# Patient Record
Sex: Male | Born: 1970 | Hispanic: Yes | Marital: Married | Smoking: Former smoker
Health system: Southern US, Community
[De-identification: ages and names within clinical notes are randomized; demographics above are authoritative.]

---

## 2018-06-09 ENCOUNTER — Emergency Department: Payer: No Typology Code available for payment source

## 2018-06-09 ENCOUNTER — Emergency Department
Admission: EM | Admit: 2018-06-09 | Discharge: 2018-06-10 | Disposition: A | Discharge status: Home / Self Care | Payer: No Typology Code available for payment source | Attending: Emergency Medicine | Admitting: Emergency Medicine

## 2018-06-09 ENCOUNTER — Encounter: Payer: Self-pay | Admitting: Radiology

## 2018-06-09 DIAGNOSIS — Y9389 Activity, other specified: Secondary | ICD-10-CM | POA: Diagnosis not present

## 2018-06-09 DIAGNOSIS — F10920 Alcohol use, unspecified with intoxication, uncomplicated: Secondary | ICD-10-CM | POA: Insufficient documentation

## 2018-06-09 DIAGNOSIS — S20211A Contusion of right front wall of thorax, initial encounter: Secondary | ICD-10-CM | POA: Insufficient documentation

## 2018-06-09 DIAGNOSIS — Y929 Unspecified place or not applicable: Secondary | ICD-10-CM | POA: Diagnosis not present

## 2018-06-09 DIAGNOSIS — R220 Localized swelling, mass and lump, head: Secondary | ICD-10-CM | POA: Diagnosis not present

## 2018-06-09 DIAGNOSIS — Y998 Other external cause status: Secondary | ICD-10-CM | POA: Insufficient documentation

## 2018-06-09 DIAGNOSIS — S29091A Other injury of muscle and tendon of front wall of thorax, initial encounter: Secondary | ICD-10-CM | POA: Diagnosis present

## 2018-06-09 LAB — COMPREHENSIVE METABOLIC PANEL
ALBUMIN: 4.9 g/dL (ref 3.5–5.0)
ALT: 85 U/L — ABNORMAL HIGH (ref 0–44)
AST: 72 U/L — ABNORMAL HIGH (ref 15–41)
Alkaline Phosphatase: 78 U/L (ref 38–126)
Anion gap: 11 (ref 5–15)
BUN: 11 mg/dL (ref 6–20)
CO2: 27 mmol/L (ref 22–32)
Calcium: 8.6 mg/dL — ABNORMAL LOW (ref 8.9–10.3)
Chloride: 107 mmol/L (ref 98–111)
Creatinine, Ser: 0.82 mg/dL (ref 0.61–1.24)
GFR calc Af Amer: >60 mL/min (ref >60)
GFR calc non Af Amer: >60 mL/min (ref >60)
Glucose, Bld: 106 mg/dL — ABNORMAL HIGH (ref 70–99)
Potassium: 3.6 mmol/L (ref 3.5–5.1)
Sodium: 145 mmol/L (ref 135–145)
Total Bilirubin: 0.7 mg/dL (ref 0.3–1.2)
Total Protein: 8.6 g/dL — ABNORMAL HIGH (ref 6.5–8.1)

## 2018-06-09 LAB — CBC WITH DIFFERENTIAL/PLATELET
Abs Immature Granulocytes: 0.04 10*3/uL (ref 0.00–0.07)
Basophils Absolute: 0.1 10*3/uL (ref 0.0–0.1)
Basophils Relative: 1 %
Eosinophils Absolute: 0.2 10*3/uL (ref 0.0–0.5)
Eosinophils Relative: 2 %
HCT: 48.4 % (ref 39.0–52.0)
Hemoglobin: 17.1 g/dL — ABNORMAL HIGH (ref 13.0–17.0)
Immature Granulocytes: 1 %
Lymphocytes Relative: 39 %
Lymphs Abs: 3.3 10*3/uL (ref 0.7–4.0)
MCH: 32.7 pg (ref 26.0–34.0)
MCHC: 35.3 g/dL (ref 30.0–36.0)
MCV: 92.5 fL (ref 80.0–100.0)
Monocytes Absolute: 0.5 10*3/uL (ref 0.1–1.0)
Monocytes Relative: 6 %
NEUTROS PCT: 51 %
NRBC: 0 % (ref 0.0–0.2)
Neutro Abs: 4.2 10*3/uL (ref 1.7–7.7)
Platelets: 249 10*3/uL (ref 150–400)
RBC: 5.23 MIL/uL (ref 4.22–5.81)
RDW: 11.5 % (ref 11.5–15.5)
WBC: 8.3 10*3/uL (ref 4.0–10.5)

## 2018-06-09 LAB — LIPASE, BLOOD: Lipase: 79 U/L — ABNORMAL HIGH (ref 11–51)

## 2018-06-09 LAB — ETHANOL: Alcohol, Ethyl (B): 322 mg/dL (ref <10)

## 2018-06-09 MED ORDER — SODIUM CHLORIDE 0.9 % IV BOLUS
1000.0000 mL | Freq: Once | INTRAVENOUS | Status: AC
  Administered 2018-06-09: 1000 mL via INTRAVENOUS

## 2018-06-09 MED ORDER — IOPAMIDOL (ISOVUE-300) INJECTION 61%
100.0000 mL | Freq: Once | INTRAVENOUS | Status: AC | PRN
  Administered 2018-06-09: 100 mL via INTRAVENOUS
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
## 2018-06-09 NOTE — ED Notes (Signed)
Pt still on toilet, says he needs a little more time; officer remains; Dr Scotty Court going in to follow up with pt
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
## 2018-06-09 NOTE — ED Notes (Signed)
Stretcher moved over by toilet in room for pt to have bowel movement; pt did not want this nurse to stay in room as he is embarrassed; officer remains with pt; able to get himself slowly but unassisted to toilet; will pull red cord when finished;
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
## 2018-06-09 NOTE — ED Notes (Signed)
Patient transported to CT 

## 2018-06-09 NOTE — ED Notes (Signed)
Date and time results received: 06/09/18 9:22 PM    Test: Ethanol Critical Value: 322  Name of Provider Notified: Scotty Court
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
## 2018-06-10 NOTE — ED Notes (Signed)
Pt awake and alert; able to ambulate down hallway of nurse's station without difficulty; pt says he has someone that can come pick him up and he will call them

## 2019-09-28 ENCOUNTER — Other Ambulatory Visit: Payer: Self-pay

## 2019-09-28 ENCOUNTER — Emergency Department (HOSPITAL_COMMUNITY): Payer: Self-pay

## 2019-09-28 ENCOUNTER — Encounter (HOSPITAL_COMMUNITY): Payer: Self-pay | Admitting: Family Medicine

## 2019-09-28 ENCOUNTER — Observation Stay (HOSPITAL_COMMUNITY)
Admission: EM | Admit: 2019-09-28 | Discharge: 2019-09-28 | Disposition: A | Discharge status: Home / Self Care | Payer: Self-pay | Attending: Family Medicine | Admitting: Family Medicine

## 2019-09-28 DIAGNOSIS — Z87891 Personal history of nicotine dependence: Secondary | ICD-10-CM | POA: Insufficient documentation

## 2019-09-28 DIAGNOSIS — Z20822 Contact with and (suspected) exposure to covid-19: Secondary | ICD-10-CM | POA: Insufficient documentation

## 2019-09-28 DIAGNOSIS — L500 Allergic urticaria: Secondary | ICD-10-CM | POA: Insufficient documentation

## 2019-09-28 DIAGNOSIS — R079 Chest pain, unspecified: Secondary | ICD-10-CM | POA: Insufficient documentation

## 2019-09-28 DIAGNOSIS — T782XXA Anaphylactic shock, unspecified, initial encounter: Secondary | ICD-10-CM

## 2019-09-28 DIAGNOSIS — X58XXXA Exposure to other specified factors, initial encounter: Secondary | ICD-10-CM | POA: Insufficient documentation

## 2019-09-28 DIAGNOSIS — R0602 Shortness of breath: Secondary | ICD-10-CM | POA: Insufficient documentation

## 2019-09-28 DIAGNOSIS — R42 Dizziness and giddiness: Secondary | ICD-10-CM | POA: Insufficient documentation

## 2019-09-28 DIAGNOSIS — T63441A Toxic effect of venom of bees, accidental (unintentional), initial encounter: Principal | ICD-10-CM | POA: Insufficient documentation

## 2019-09-28 HISTORY — DX: Anaphylactic shock, unspecified, initial encounter: T78.2XXA

## 2019-09-28 LAB — BASIC METABOLIC PANEL
Anion gap: 16 — ABNORMAL HIGH (ref 5–15)
BUN: 19 mg/dL (ref 6–20)
CO2: 18 mmol/L — ABNORMAL LOW (ref 22–32)
Calcium: 8.9 mg/dL (ref 8.9–10.3)
Chloride: 107 mmol/L (ref 98–111)
Creatinine, Ser: 1.09 mg/dL (ref 0.61–1.24)
GFR calc Af Amer: >60 mL/min (ref >60)
GFR calc non Af Amer: >60 mL/min (ref >60)
Glucose, Bld: 156 mg/dL — ABNORMAL HIGH (ref 70–99)
Potassium: 4 mmol/L (ref 3.5–5.1)
Sodium: 141 mmol/L (ref 135–145)

## 2019-09-28 LAB — CBC WITH DIFFERENTIAL/PLATELET
Abs Immature Granulocytes: 0.02 10*3/uL (ref 0.00–0.07)
Basophils Absolute: 0 10*3/uL (ref 0.0–0.1)
Basophils Relative: 0 %
Eosinophils Absolute: 0.1 10*3/uL (ref 0.0–0.5)
Eosinophils Relative: 2 %
HCT: 53.4 % — ABNORMAL HIGH (ref 39.0–52.0)
Hemoglobin: 18.6 g/dL — ABNORMAL HIGH (ref 13.0–17.0)
Immature Granulocytes: 0 %
Lymphocytes Relative: 69 %
Lymphs Abs: 4.9 10*3/uL — ABNORMAL HIGH (ref 0.7–4.0)
MCH: 32.9 pg (ref 26.0–34.0)
MCHC: 34.8 g/dL (ref 30.0–36.0)
MCV: 94.3 fL (ref 80.0–100.0)
Monocytes Absolute: 0.3 10*3/uL (ref 0.1–1.0)
Monocytes Relative: 4 %
Neutro Abs: 1.8 10*3/uL (ref 1.7–7.7)
Neutrophils Relative %: 25 %
Platelets: 284 10*3/uL (ref 150–400)
RBC: 5.66 MIL/uL (ref 4.22–5.81)
RDW: 11.6 % (ref 11.5–15.5)
WBC: 7.1 10*3/uL (ref 4.0–10.5)
nRBC: 0 % (ref 0.0–0.2)

## 2019-09-28 LAB — SARS CORONAVIRUS 2 BY RT PCR (HOSPITAL ORDER, PERFORMED IN ~~LOC~~ HOSPITAL LAB): SARS Coronavirus 2: NEGATIVE

## 2019-09-28 MED ORDER — ONDANSETRON HCL 4 MG/2ML IJ SOLN: 4.0000 mg | Freq: Four times a day (QID) | INTRAMUSCULAR | Status: DC | PRN

## 2019-09-28 MED ORDER — SODIUM CHLORIDE 0.9 % IV BOLUS
1000.0000 mL | Freq: Once | INTRAVENOUS | Status: AC
  Administered 2019-09-28: 1000 mL via INTRAVENOUS

## 2019-09-28 MED ORDER — METHYLPREDNISOLONE SODIUM SUCC 125 MG IJ SOLR
125.0000 mg | Freq: Once | INTRAMUSCULAR | Status: AC
  Administered 2019-09-28: 125 mg via INTRAVENOUS

## 2019-09-28 MED ORDER — ALBUTEROL SULFATE (2.5 MG/3ML) 0.083% IN NEBU: 2.5000 mg | INHALATION_SOLUTION | RESPIRATORY_TRACT | Status: DC | PRN

## 2019-09-28 MED ORDER — ACETAMINOPHEN 325 MG PO TABS: 650.0000 mg | ORAL_TABLET | Freq: Four times a day (QID) | ORAL | Status: DC | PRN

## 2019-09-28 MED ORDER — EPINEPHRINE 0.3 MG/0.3ML IJ SOAJ: 0.3000 mg | INTRAMUSCULAR | 3 refills | Status: DC | PRN

## 2019-09-28 MED ORDER — EPINEPHRINE 1 MG/10ML IJ SOSY
PREFILLED_SYRINGE | INTRAMUSCULAR | Status: AC
  Filled 2019-09-28: qty 10

## 2019-09-28 MED ORDER — ACETAMINOPHEN 650 MG RE SUPP: 650.0000 mg | Freq: Four times a day (QID) | RECTAL | Status: DC | PRN

## 2019-09-28 MED ORDER — EPINEPHRINE 0.3 MG/0.3ML IJ SOAJ
INTRAMUSCULAR | Status: AC
  Administered 2019-09-28: 0.3 mg
  Filled 2019-09-28: qty 0.3

## 2019-09-28 MED ORDER — DIPHENHYDRAMINE HCL 50 MG/ML IJ SOLN
50.0000 mg | Freq: Once | INTRAMUSCULAR | Status: AC
  Administered 2019-09-28: 50 mg via INTRAVENOUS

## 2019-09-28 MED ORDER — METHYLPREDNISOLONE SODIUM SUCC 125 MG IJ SOLR: 60.0000 mg | Freq: Every day | INTRAMUSCULAR | Status: DC

## 2019-09-28 MED ORDER — EPINEPHRINE HCL 5 MG/250ML IV SOLN IN NS
1.0000 ug/min | INTRAVENOUS | Status: DC
  Filled 2019-09-28: qty 250

## 2019-09-28 MED ORDER — DIPHENHYDRAMINE HCL 25 MG PO CAPS
25.0000 mg | ORAL_CAPSULE | Freq: Four times a day (QID) | ORAL | Status: DC | PRN
  Administered 2019-09-28: 25 mg via ORAL
  Filled 2019-09-28: qty 1

## 2019-09-28 MED ORDER — EPINEPHRINE 0.3 MG/0.3ML IJ SOAJ: 0.3000 mg | INTRAMUSCULAR | 3 refills | Status: AC | PRN

## 2019-09-28 MED ORDER — ONDANSETRON HCL 4 MG PO TABS: 4.0000 mg | ORAL_TABLET | Freq: Four times a day (QID) | ORAL | Status: DC | PRN

## 2019-09-28 NOTE — ED Notes (Signed)
ED TO INPATIENT HANDOFF REPORT  ED Nurse Name and Phone #: [phone]  S Name/Age/Gender Craig Lloyd 49 y.o. male Room/Bed: RESB/RESB  Code Status   Code Status: Full Code  Home/SNF/Other Home Patient oriented to: self, place, time and situation Is this baseline? Yes   Triage Complete: Triage complete  Chief Complaint Anaphylaxis [T78.2XXA]  Triage Note Pt to ed with c/o of allergic rx to bee sting. Pt hypotensive, diaphoretic, and lethargic on arrival.     Allergies Allergies  Allergen Reactions  . Bee Venom Anaphylaxis  . Other Anaphylaxis    Red Ants    Level of Care/Admitting Diagnosis ED Disposition    ED Disposition Condition Comment   Admit  Hospital Area: [facility] [[id number]]  Level of Care: Telemetry [5]  Admit to tele based on following criteria: Eval of Syncope  Covid Evaluation: Asymptomatic Screening Protocol (No Symptoms)  Diagnosis: Anaphylaxis [086578]  Admitting Physician: Alberteen Sam [[id number]]  Attending Physician: Alberteen Sam [[id number]]       B Medical/Surgery History Past Medical History:  Diagnosis Date  . Anaphylaxis 09/28/2019   To bee sting   History reviewed. No pertinent surgical history.   A IV Location/Drains/Wounds Patient Lines/Drains/Airways Status   Active Line/Drains/Airways    Name:   Placement date:   Placement time:   Site:   Days:   Peripheral IV 09/28/19 Left Antecubital   09/28/19    0854    Antecubital   less than 1          Intake/Output Last 24 hours  Intake/Output Summary (Last 24 hours) at 09/28/2019 1508 Last data filed at 09/28/2019 1123 Gross per 24 hour  Intake 1000 ml  Output 400 ml  Net 600 ml    Labs/Imaging Results for orders placed or performed during the hospital encounter of 09/28/19 (from the past 48 hour(s))  Basic metabolic panel     Status: Abnormal   Collection Time: 09/28/19  9:10 AM  Result Value Ref Range   Sodium 141 135 - 145 mmol/L   Potassium 4.0 3.5 - 5.1 mmol/L   Chloride 107 98 - 111 mmol/L   CO2 18 (L) 22 - 32 mmol/L   Glucose, Bld 156 (H) 70 - 99 mg/dL    Comment: Glucose reference range applies only to samples taken after fasting for at least 8 hours.   BUN 19 6 - 20 mg/dL   Creatinine, Ser 4.01 0.61 - 1.24 mg/dL   Calcium 8.9 8.9 - 02.7 mg/dL   GFR calc non Af Amer >60 >60 mL/min   GFR calc Af Amer >60 >60 mL/min   Anion gap 16 (H) 5 - 15    Comment: Performed at [facility], [address] [place], [place] [address]  CBC with Differential     Status: Abnormal   Collection Time: 09/28/19  9:10 AM  Result Value Ref Range   WBC 7.1 4.0 - 10.5 K/uL   RBC 5.66 4.22 - 5.81 MIL/uL   Hemoglobin 18.6 (H) 13.0 - 17.0 g/dL   HCT 44.0 (H) 34.7 - 42.5 %   MCV 94.3 80.0 - 100.0 fL   MCH 32.9 26.0 - 34.0 pg   MCHC 34.8 30.0 - 36.0 g/dL   RDW 95.6 38.7 - 56.4 %   Platelets 284 150 - 400 K/uL   nRBC 0.0 0.0 - 0.2 %   Neutrophils Relative % 25 %   Neutro Abs 1.8 1.7 - 7.7 K/uL  Lymphocytes Relative 69 %   Lymphs Abs 4.9 (H) 0.7 - 4.0 K/uL   Monocytes Relative 4 %   Monocytes Absolute 0.3 0.1 - 1.0 K/uL   Eosinophils Relative 2 %   Eosinophils Absolute 0.1 0.0 - 0.5 K/uL   Basophils Relative 0 %   Basophils Absolute 0.0 0.0 - 0.1 K/uL   Immature Granulocytes 0 %   Abs Immature Granulocytes 0.02 0.00 - 0.07 K/uL    Comment: Performed at [facility], [address], [place], [place] [address]  SARS Coronavirus 2 by RT PCR (hospital order, performed in [facility] hospital lab) Nasopharyngeal Nasopharyngeal Swab     Status: None   Collection Time: 09/28/19  9:10 AM   Specimen: Nasopharyngeal Swab  Result Value Ref Range   SARS Coronavirus 2 NEGATIVE NEGATIVE    Comment: (NOTE) SARS-CoV-2 target nucleic acids are NOT DETECTED. The SARS-CoV-2 RNA is generally detectable in upper and lower respiratory specimens during the acute phase of infection. The lowest concentration  of SARS-CoV-2 viral copies this assay can detect is 250 copies / mL. A negative result does not preclude SARS-CoV-2 infection and should not be used as the sole basis for treatment or other patient management decisions.  A negative result may occur with improper specimen collection / handling, submission of specimen other than nasopharyngeal swab, presence of viral mutation(s) within the areas targeted by this assay, and inadequate number of viral copies (<250 copies / mL). A negative result must be combined with clinical observations, patient history, and epidemiological information. Fact Sheet for Patients:   BoilerBrush.com.cy Fact Sheet for Healthcare Providers: https://pope.com/ This test is not yet approved or cleared  by the [place] FDA and has been authorized for detection and/or diagnosis of SARS-CoV-2 by FDA under an Emergency Use Authorization (EUA).  This EUA will remain in effect (meaning this test can be used) for the duration of the COVID-19 declaration under Section 564(b)(1) of the Act, 21 U.S.C. section 360bbb-3(b)(1), unless the authorization is terminated or revoked sooner. Performed at [facility], [address] [place], [place] [address]    DG Chest Portable 1 View  Result Date: 09/28/2019 CLINICAL DATA:  Dyspnea and chest pain EXAM: PORTABLE CHEST 1 VIEW COMPARISON:  06/09/2018 chest radiograph. FINDINGS: Stable cardiomediastinal silhouette with normal heart size. No pneumothorax. No pleural effusion. Lungs appear clear, with no acute consolidative airspace disease and no pulmonary edema. IMPRESSION: No active disease. Electronically Signed   By: Delbert Phenix M.D.   On: 09/28/2019 10:01    Pending Labs Unresulted Labs (From admission, onward)    Start     Ordered   09/28/19 1435  HIV Antibody (routine testing w rflx)  (HIV Antibody (Routine testing w reflex) panel)  Add-on,   AD      09/28/19 1434          Vitals/Pain Today's Vitals   09/28/19 1330 09/28/19 1400 09/28/19 1430 09/28/19 1500  BP: (!) 130/91 (!) 134/97 (!) 139/104 (!) 152/107  Pulse: 91 78 78 71  Resp: 16 15 15 14   SpO2: 97% 95% 93% 97%  PainSc:        Isolation Precautions No active isolations  Medications Medications  EPINEPHrine (ADRENALIN) 1 MG/10ML injection (has no administration in time range)  acetaminophen (TYLENOL) tablet 650 mg (has no administration in time range)    Or  acetaminophen (TYLENOL) suppository 650 mg (has no administration in time range)  ondansetron (ZOFRAN) tablet 4 mg (has no  administration in time range)    Or  ondansetron (ZOFRAN) injection 4 mg (has no administration in time range)  albuterol (PROVENTIL) (2.5 MG/3ML) 0.083% nebulizer solution 2.5 mg (has no administration in time range)  diphenhydrAMINE (BENADRYL) capsule 25 mg (has no administration in time range)  methylPREDNISolone sodium succinate (SOLU-MEDROL) 125 mg/2 mL injection 60 mg (has no administration in time range)  EPINEPHrine (EPI-PEN) 0.3 mg/0.3 mL injection (0.3 mg  Given 09/28/19 0850)  sodium chloride 0.9 % bolus 1,000 mL (0 mLs Intravenous Stopped 09/28/19 1123)  diphenhydrAMINE (BENADRYL) injection 50 mg (50 mg Intravenous Given 09/28/19 0850)  methylPREDNISolone sodium succinate (SOLU-MEDROL) 125 mg/2 mL injection 125 mg (125 mg Intravenous Given 09/28/19 0851)    Mobility walks High fall risk   Focused Assessments Pulmonary Assessment Handoff:  Lung sounds:   O2 Device: Nasal Cannula O2 Flow Rate (L/min): 10 L/min      R Recommendations: See Admitting Provider Note  Report given to: Amy, RN

## 2019-09-28 NOTE — ED Provider Notes (Signed)
[facility]-EMERGENCY DEPT Provider Note   CSN: [id number] Arrival date & time: 09/28/19  0845     History CC: Allergic reaction  Craig Lloyd is a 49 y.o. 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  Patient was brought in by his brother-in-law today.  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  The patient was stung in one of his arms by a bee.  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  He does not carry an EpiPen.  Soon afterwards patient was complaining of dizziness and difficulty speaking to his brother, prompted brought him to the nearest ER, which [facility].  On arrival the patient is awake but unable to verbalize.  His brother in law says that this is a common reaction for him after a bee sting.  His brother-in-law states that the patient has no other medical issues and does not take any other medications at baseline  HPI     Past Medical History:  Diagnosis Date  . Anaphylaxis 09/28/2019   To bee sting    Patient Active Problem List   Diagnosis Date Noted  . Anaphylaxis 09/28/2019    History reviewed. No pertinent surgical history.     Family History  Problem Relation Age of Onset  . Allergy (severe) Neg Hx     Social History   Tobacco Use  . Smoking status: Former Games developer  . Smokeless tobacco: Never Used  Substance Use Topics  . Alcohol use: Not on file  . Drug use: Not on file    Home Medications Prior to Admission medications   Not on File    Allergies    Bee venom and Other  Review of Systems   Review of Systems  Constitutional: Negative for chills and fever.  Eyes: Negative for pain and visual disturbance.  Respiratory: Positive for shortness of breath and wheezing. Negative for cough.   Cardiovascular: Positive for chest pain. Negative for palpitations.  Gastrointestinal:  Negative for abdominal pain and vomiting.  Genitourinary: Negative for dysuria and hematuria.  Musculoskeletal: Negative for arthralgias and back pain.  Skin: Positive for color change and rash.  Allergic/Immunologic: Positive for environmental allergies. Negative for immunocompromised state.  Neurological: Positive for light-headedness. Negative for syncope.  Psychiatric/Behavioral: Negative for agitation and confusion.  All other systems reviewed and are negative.   Physical Exam Updated Vital Signs BP 122/86 (BP Location: Right Arm)   Pulse 75   Temp 98 F (36.7 C) (Oral)   Resp 14   SpO2 100%   Physical Exam Vitals and nursing note reviewed.  Constitutional:      Appearance: He is well-developed.     Comments: Will only whisper with effort, does not appear to fully comprehend  HENT:     Head: Normocephalic and atraumatic.     Comments: Oropharynx non-erythematous.  No tonsillar swelling or exudate.  No uvular deviation.  No drooling. No brawny edema. No stridor. Voice is not muffled. Eyes:     Conjunctiva/sclera: Conjunctivae normal.     Pupils: Pupils are equal, round, and reactive to light.  Cardiovascular:     Rate and Rhythm: Regular rhythm. Tachycardia present.     Pulses: Normal pulses.  Pulmonary:     Effort: Pulmonary effort is normal. No respiratory distress.     Comments: Faint diffuse expiratory wheezing Abdominal:     Palpations: Abdomen is  soft.     Tenderness: There is no abdominal tenderness.  Musculoskeletal:     Cervical back: Neck supple.     Comments: Diffuse erythematous rash of the face and body  Skin:    General: Skin is warm and dry.  Neurological:     Mental Status: He is alert.     Comments: Follows commands, nods yes/no appropriately to questions     ED Results / Procedures / Treatments   Labs (all labs ordered are listed, but only abnormal results are displayed) Labs Reviewed  BASIC METABOLIC PANEL - Abnormal; Notable for the  following components:      Result Value   CO2 18 (*)    Glucose, Bld 156 (*)    Anion gap 16 (*)    All other components within normal limits  CBC WITH DIFFERENTIAL/PLATELET - Abnormal; Notable for the following components:   Hemoglobin 18.6 (*)    HCT 53.4 (*)    Lymphs Abs 4.9 (*)    All other components within normal limits  SARS CORONAVIRUS 2 BY RT PCR (HOSPITAL ORDER, PERFORMED IN  HOSPITAL LAB)  HIV ANTIBODY (ROUTINE TESTING W REFLEX)    EKG EKG Interpretation  Date/Time:  Saturday Sep 28 2019 08:48:48 EDT Ventricular Rate:  101 PR Interval:    QRS Duration: 88 QT Interval:  367 QTC Calculation: 476 R Axis:   74 Text Interpretation: Sinus tachycardia Right atrial enlargement Borderline ST depression, diffuse leads Borderline prolonged QT interval NO STEMI Confirmed by Alvester Chou [phone]) on 09/28/2019 9:28:49 AM   Radiology DG Chest Portable 1 View  Result Date: 09/28/2019 CLINICAL DATA:  Dyspnea and chest pain EXAM: PORTABLE CHEST 1 VIEW COMPARISON:  06/09/2018 chest radiograph. FINDINGS: Stable cardiomediastinal silhouette with normal heart size. No pneumothorax. No pleural effusion. Lungs appear clear, with no acute consolidative airspace disease and no pulmonary edema. IMPRESSION: No active disease. Electronically Signed   By: Delbert Phenix M.D.   On: 09/28/2019 10:01    Procedures .Critical Care Performed by: Terald Sleeper, MD Authorized by: Terald Sleeper, MD   Critical care provider statement:    Critical care time (minutes):  60   Critical care was necessary to treat or prevent imminent or life-threatening deterioration of the following conditions:  Shock   Critical care was time spent personally by me on the following activities:  Discussions with consultants, evaluation of patient's response to treatment, examination of patient, ordering and performing treatments and interventions, ordering and review of laboratory studies, ordering and  review of radiographic studies, pulse oximetry, re-evaluation of patient's condition, obtaining history from patient or surrogate and review of old charts Comments:     Anaphylaxis with hypotension and shock requiring multiple rounds of epinephrine and frequent bedside reassessments   (including critical care time)  Medications Ordered in ED Medications  EPINEPHrine (ADRENALIN) 1 MG/10ML injection (has no administration in time range)  acetaminophen (TYLENOL) tablet 650 mg (has no administration in time range)    Or  acetaminophen (TYLENOL) suppository 650 mg (has no administration in time range)  ondansetron (ZOFRAN) tablet 4 mg (has no administration in time range)    Or  ondansetron (ZOFRAN) injection 4 mg (has no administration in time range)  albuterol (PROVENTIL) (2.5 MG/3ML) 0.083% nebulizer solution 2.5 mg (has no administration in time range)  diphenhydrAMINE (BENADRYL) capsule 25 mg (has no administration in time range)  methylPREDNISolone sodium succinate (SOLU-MEDROL) 125 mg/2 mL injection 60 mg (has no administration in time range)  EPINEPHrine (EPI-PEN) 0.3 mg/0.3 mL injection (0.3 mg  Given 09/28/19 0850)  sodium chloride 0.9 % bolus 1,000 mL (0 mLs Intravenous Stopped 09/28/19 1123)  diphenhydrAMINE (BENADRYL) injection 50 mg (50 mg Intravenous Given 09/28/19 0850)  methylPREDNISolone sodium succinate (SOLU-MEDROL) 125 mg/2 mL injection 125 mg (125 mg Intravenous Given 09/28/19 0851)    ED Course  I have reviewed the triage vital signs and the nursing notes.  Pertinent labs & imaging results that were available during my care of the patient were reviewed by me and considered in my medical decision making (see chart for details).  49 yo male presenting with concern for anaphylaxis in the setting of a bee sting while working outdoors this morning.  Stung approx 30 minutes prior to arrival with family.  Patient hypotensive on arrival, appeared confused, not comprehending my  speech (or brother's speech in Spanish), and refusing or unable to talk.  He was immediately given IM epinephrine, IV solumedrol and IV benadryl.  Subsequently his pulse oxygen was noted to be 82%, and I asked for a second dose of IM epinephrine to be given, while ordering an epinephrine infusion from pharmacy.  Immediately after the 2nd dose was given, we relocated his pulse oximeter from his finger to his ear, found a much better pleth reading, which showed 100% on room air.  I felt the initial hypoxia may have been related to poor distal perfusion or reading on his finger, machine error.  The epinephrine infusion was never started.  His initial airway exam showed no objective evidence of laryngoedema or airway swelling, and no significant wheezing, to warrant intubation.  I suspect his difficulty or reluctance to speak may have been due to his transient encephalopathy from his hypotension and reaction.    After epinephrine was given (x 2, intramuscularly), his blood pressure, mental status, and speech subsequently improved.  His mental status returned to baseline.  I felt he was stable for a medical observation admission, and did advise medical observation to him based on the severity of his reaction.  He will need epi-pen prescriptions and training while in the hospital prior to discharge.  He verbalized understanding and his brother-in-law was in agreement with the plan as well.  Clinical Course as of Sep 27 1633  Sat Sep 28, 2019  0911 On arrival in the ED, was called on to the room as the patient initially had diffuse body rash and was hypotensive with a systolic pressure in the high 41L.  He was immediately given IM epinephrine.  His blood pressure subsequently has improved.  He received a second dose of IM epinephrine 5 minutes after the first, with concern initially that he may be hypoxic.  However after the pulse ox was relocated from his finger to his ear, we obtained a more reliable Plath and he  appeared to be 100% SPO2.  His blood pressure has been holding stable.  He has no evidence of tongue or lip swelling.  He has no stridor or upper airway phonation on exam.  He has very mild expiratory wheezing.   [MT]  [id number] Complaining of chest pain following 2 doses of IM epi, BP now 170 systolic, HR 110, suspect this is secondary to epinephrine, will continue to monitor    [MT]  0928 He is able to speak now, asking for water   [MT]  1013 Chest pain improved, BP stable.  He is speaking clearly now.  Okay for medicine admission, I do not feel he needs to  an ICU at this time.   [MT]  1120 Admitted to hospitalist, vitals stable, for observation   [MT]    Clinical Course User Index [MT] Ravan Schlemmer, Carola Rhine, MD    Final Clinical Impression(s) / ED Diagnoses Final diagnoses:  Anaphylaxis, initial encounter    Rx / DC Orders ED Discharge Orders    None       Wyvonnia Dusky, MD 09/28/19 [phone]

## 2019-09-28 NOTE — Progress Notes (Signed)
During DC instructions the patient took off his paper scrub shirt provided to him to go home wearing and had hives around his lower neck and a few on the side of his abdomen. Patient states that this is from the paper shirt provided by the hospital and not from the bee sting. MD paged and made aware. The patient is agreeable to stay one hour to make sure he improves and does not get worse. Otherwise he will stay. He has been been instructed that once he does leave and experiences new onset of swelling, warmth, redness, hives, or difficulty breathnig/ wheezing to return to the ED or call 911 right away. Information/education provided about importance to carrying his epi pen.

## 2019-09-28 NOTE — Progress Notes (Signed)
Patient just arrived to floor from ED and would like to leave. He states he feels fine and is not needing to be hospitalized. MD made aware and at bedside.

## 2019-09-28 NOTE — H&P (Signed)
History and Physical  Patient Name: Craig Lloyd     TAV:[id number]    DOB: 10/22/1970    DOA: 09/28/2019 PCP: Patient, No Pcp Per  Patient coming from: Work  Chief Complaint: Anaphylaxis      HPI: Craig Lloyd is a 49 y.o. M with no significant PMHx who presents with acute onset nausea, dizziness, urticaria and hypotension after bee sting.  All history collected via videophonic interpreter.   Patient was gardening when he lifted a piece of bark off a tree and bees flew out.  One stung him on the right hand, and within minutes, he had hives, nausea, dizziness.  His developed stridor, confusion and SOB and so his brother brought him to the ER.  In the ER, his initial BP was 70s/40s.  He was given IM epinephrine twice, solumedrol and diphenhydramine as well as albuterol and IV fluids and oxygen.    His BP improved to normal quickly and the hospialist group was asked to observe post-anaphylaxis.         ROS: Review of Systems  Respiratory: Positive for shortness of breath and stridor.   Gastrointestinal: Positive for nausea.  Skin: Positive for itching and rash.  Neurological: Positive for dizziness.  All other systems reviewed and are negative.         Past Medical History:  Diagnosis Date  . 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 No pertinent surgical history.  Social History: Patient lives with his family.  The patient walks unassisted.  Prior heavy alcohol use, nonsmoker.  Allergies  Allergen Reactions  . Bee Venom Anaphylaxis  . Other Anaphylaxis    Red Ants    Family history: Family history reviewed, none pertinent.  Prior to Admission medications   Not on File       Physical Exam: BP 122/86 (BP Location: Right Arm)   Pulse 75   Temp 98 F (36.7 C) (Oral)   Resp 14   SpO2 100%  General appearance: Well-developed, adult male, alert and in no acute distress.   Eyes: Anicteric, conjunctiva pink, lids and lashes normal. PERRL.      ENT: No nasal deformity, discharge, epistaxis.  Hearing normal. OP moist without lesions. TOngue and lips normal without swelling.  Dentition good.  Neck: No neck masses.  Trachea midline.  No thyromegaly/tenderness. Lymph: No cervical or supraclavicular lymphadenopathy. Skin: Warm and dry.  No jaundice.  No suspicious rashes or lesions. Cardiac: RRR, nl S1-S2, no murmurs appreciated.  Capillary refill is brisk.  JVP normal.  No LE edema.  Radial pulses 2+ and symmetric. Respiratory: Normal respiratory rate and rhythm.  CTAB without rales or wheezes. Abdomen: Abdomen soft.  No TTP. No ascites, distension, hepatosplenomegaly.   MSK: No deformities or effusions of the large joints of the upper or lower extremities bilaterally.  No cyanosis or clubbing. Neuro: Cranial nerves normal.  Sensation intact to light touch. Speech is fluent.  Muscle strength 5/5.    Psych: Sensorium intact and responding to questions, attention normal.  Behavior appropriate.  Affect normal.  Judgment and insight appear normal.     Labs on Admission:  I have personally reviewed following labs and imaging studies: CBC: Recent Labs  Lab 09/28/19 0910  WBC 7.1  NEUTROABS 1.8  HGB 18.6*  HCT 53.4*  MCV 94.3  PLT 284   Basic Metabolic Panel: Recent Labs  Lab 09/28/19 0910  NA 141  K 4.0  CL 107  CO2 18*  GLUCOSE  156*  BUN 19  CREATININE 1.09  CALCIUM 8.9   GFR: CrCl cannot be calculated (Unknown ideal weight.).  Liver Function Tests: No results for input(s): AST, ALT, ALKPHOS, BILITOT, PROT, ALBUMIN in the last 168 hours. No results for input(s): LIPASE, AMYLASE in the last 168 hours. No results for input(s): AMMONIA in the last 168 hours. Coagulation Profile: No results for input(s): INR, PROTIME in the last 168 hours. Cardiac Enzymes: No results for input(s): CKTOTAL, CKMB, CKMBINDEX, TROPONINI in the last 168 hours. BNP (last 3 results) No results for input(s): PROBNP in the last 8760  hours. HbA1C: No results for input(s): HGBA1C in the last 72 hours. CBG: No results for input(s): GLUCAP in the last 168 hours. Lipid Profile: No results for input(s): CHOL, HDL, LDLCALC, TRIG, CHOLHDL, LDLDIRECT in the last 72 hours. Thyroid Function Tests: No results for input(s): TSH, T4TOTAL, FREET4, T3FREE, THYROIDAB in the last 72 hours. Anemia Panel: No results for input(s): VITAMINB12, FOLATE, FERRITIN, TIBC, IRON, RETICCTPCT in the last 72 hours. Sepsis Labs:   Recent Results (from the past 240 hour(s))  SARS Coronavirus 2 by RT PCR (hospital order, performed in [facility] hospital lab) Nasopharyngeal Nasopharyngeal Swab     Status: None   Collection Time: 09/28/19  9:10 AM   Specimen: Nasopharyngeal Swab  Result Value Ref Range Status   SARS Coronavirus 2 NEGATIVE NEGATIVE Final    Comment: (NOTE) SARS-CoV-2 target nucleic acids are NOT DETECTED. The SARS-CoV-2 RNA is generally detectable in upper and lower respiratory specimens during the acute phase of infection. The lowest concentration of SARS-CoV-2 viral copies this assay can detect is 250 copies / mL. A negative result does not preclude SARS-CoV-2 infection and should not be used as the sole basis for treatment or other patient management decisions.  A negative result may occur with improper specimen collection / handling, submission of specimen other than nasopharyngeal swab, presence of viral mutation(s) within the areas targeted by this assay, and inadequate number of viral copies (<250 copies / mL). A negative result must be combined with clinical observations, patient history, and epidemiological information. Fact Sheet for Patients:   BoilerBrush.com.cy Fact Sheet for Healthcare Providers: https://pope.com/ This test is not yet approved or cleared  by the [place] FDA and has been authorized for detection and/or diagnosis of SARS-CoV-2 by FDA under an  Emergency Use Authorization (EUA).  This EUA will remain in effect (meaning this test can be used) for the duration of the COVID-19 declaration under Section 564(b)(1) of the Act, 21 U.S.C. section 360bbb-3(b)(1), unless the authorization is terminated or revoked sooner. Performed at [facility], [address], [place], [place] [address]            Radiological Exams on Admission: Personally reviewed CXR is clear: DG Chest Portable 1 View  Result Date: 09/28/2019 CLINICAL DATA:  Dyspnea and chest pain EXAM: PORTABLE CHEST 1 VIEW COMPARISON:  06/09/2018 chest radiograph. FINDINGS: Stable cardiomediastinal silhouette with normal heart size. No pneumothorax. No pleural effusion. Lungs appear clear, with no acute consolidative airspace disease and no pulmonary edema. IMPRESSION: No active disease. Electronically Signed   By: Delbert Phenix M.D.   On: 09/28/2019 10:01           Assessment/Plan   Anaphylaxis  Given two doses of epinephrine were thought necessary and the roughly 60 minutes delay between sting and epi administration, the patient is at higher risk of biphasic reaction. -Monitor on telemetry -Continue Solu-medrol 60 mg daily  for now -Observe overnight      DVT prophylaxis: Lovenox  Code Status: FULL  Family Communication:   Disposition Plan: Anticipate observation overnight.  If no further reaction, likely home in morning. Consults called: None Admission status: OBS   At the point of initial evaluation, it is my clinical opinion that admission for OBSERVATION is reasonable and necessary because the patient's presenting complaints in the context of their chronic conditions represent sufficient risk of deterioration or significant morbidity to constitute reasonable grounds for close observation in the hospital setting, but that the patient may be medically stable for discharge from the hospital within 24 to 48 hours.    Medical decision  making: Patient seen at 4:29 PM on 09/28/2019.  The patient was discussed with Dr. Langston Masker.  What exists of the patient's chart was reviewed in depth and summarized above.  Clinical condition: stablized BP and HR, normal mentation.        [facility] Please page though [place] or Epic secure chat:  For password, contact charge nurse

## 2019-09-28 NOTE — Discharge Summary (Signed)
[facility] Discharge Summary   Patient: Craig Lloyd [id number]:[id number]   PCP: Patient, No Pcp Per DOB: 08/09/1970   Date of admission: 09/28/2019   Date of discharge: 09/28/2019      Discharge Diagnoses:  Principal Problem:   Anaphylaxis    Discharge Condition: fair    History of present illness:      Principal discharge diagnosis Anaphylaxis    Hospital Course:  The patient was admitted to a monitored bed.  He was instructed on the nature of anaphylaxis, the lifelong threat, the need to maintain an active Epi-pen available if he has future envenomations AT ALL TIMES.  He was given an Epi-pen prescription and the life-threatening nature of anaphylaxis was repeated.  All of this was through videophonic interpreter.  THe patient however, insisted to leave, and felt he was fine.  He had good mentation and appeared to be of sound mind.  He was given instructions on taking diphenhydramine for itching/hives, and given strict return precautions and instructions to obtain a PCP including instructions on where to seek one.        Patient left the hospital AMA.       Procedures and Results:     Consultations:    The results of significant diagnostics from this hospitalization (including imaging, microbiology, ancillary and laboratory) are listed below for reference.    Significant Diagnostic Studies: DG Chest Portable 1 View  Result Date: 09/28/2019 CLINICAL DATA:  Dyspnea and chest pain EXAM: PORTABLE CHEST 1 VIEW COMPARISON:  06/09/2018 chest radiograph. FINDINGS: Stable cardiomediastinal silhouette with normal heart size. No pneumothorax. No pleural effusion. Lungs appear clear, with no acute consolidative airspace disease and no pulmonary edema. IMPRESSION: No active disease. Electronically Signed   By: Ilona Sorrel M.D.   On: 09/28/2019 10:01    Microbiology: Recent Results (from the past 240 hour(s))  SARS Coronavirus 2 by RT PCR (hospital order, performed  in [facility] hospital lab) Nasopharyngeal Nasopharyngeal Swab     Status: None   Collection Time: 09/28/19  9:10 AM   Specimen: Nasopharyngeal Swab  Result Value Ref Range Status   SARS Coronavirus 2 NEGATIVE NEGATIVE Final    Comment: (NOTE) SARS-CoV-2 target nucleic acids are NOT DETECTED. The SARS-CoV-2 RNA is generally detectable in upper and lower respiratory specimens during the acute phase of infection. The lowest concentration of SARS-CoV-2 viral copies this assay can detect is 250 copies / mL. A negative result does not preclude SARS-CoV-2 infection and should not be used as the sole basis for treatment or other patient management decisions.  A negative result may occur with improper specimen collection / handling, submission of specimen other than nasopharyngeal swab, presence of viral mutation(s) within the areas targeted by this assay, and inadequate number of viral copies (<250 copies / mL). A negative result must be combined with clinical observations, patient history, and epidemiological information. Fact Sheet for Patients:   StrictlyIdeas.no Fact Sheet for Healthcare Providers: BankingDealers.co.za This test is not yet approved or cleared  by the [place] FDA and has been authorized for detection and/or diagnosis of SARS-CoV-2 by FDA under an Emergency Use Authorization (EUA).  This EUA will remain in effect (meaning this test can be used) for the duration of the COVID-19 declaration under Section 564(b)(1) of the Act, 21 U.S.C. section 360bbb-3(b)(1), unless the authorization is terminated or revoked sooner. Performed at [facility], [place] [address], [place], [place] [address]      Labs: CBC: Recent Labs  Lab 09/28/19 0910  WBC 7.1  NEUTROABS 1.8  HGB 18.6*  HCT 53.4*  MCV 94.3  PLT 284   Basic Metabolic Panel: Recent Labs  Lab 09/28/19 0910  NA 141  K 4.0  CL 107  CO2 18*    GLUCOSE 156*  BUN 19  CREATININE 1.09  CALCIUM 8.9   Liver Function Tests: No results for input(s): AST, ALT, ALKPHOS, BILITOT, PROT, ALBUMIN in the last 168 hours. No results for input(s): LIPASE, AMYLASE in the last 168 hours. No results for input(s): AMMONIA in the last 168 hours. Cardiac Enzymes: No results for input(s): CKTOTAL, CKMB, CKMBINDEX, TROPONINI in the last 168 hours. BNP (last 3 results) No results for input(s): BNP in the last 8760 hours. CBG: No results for input(s): GLUCAP in the last 168 hours. Time spent: 15 minutes  Signed:  Alberteen Sam  [facility] 09/28/2019, 9:29 PM

## 2019-09-28 NOTE — ED Triage Notes (Signed)
Pt to ed with c/o of allergic rx to bee sting. Pt hypotensive, diaphoretic, and lethargic on arrival.

## 2019-09-28 NOTE — ED Notes (Signed)
Called RN, requested to call back bc dealing with a patient that was choking

## 2019-09-28 NOTE — ED Notes (Signed)
Pt ambulated to restroom and back with little assistance.

## 2019-09-28 NOTE — Progress Notes (Signed)
Nsg Discharge Note  Admit Date:  09/28/2019 Discharge date: 09/28/2019   Jowan Skillin to be D/C'd home per MD order.  AVS completed.  Patient/caregiver able to verbalize understanding.  Discharge Medication: Allergies as of 09/28/2019      Reactions   Bee Venom Anaphylaxis   Other Anaphylaxis   Red Ants      Medication List    TAKE these medications   EPINEPHrine 0.3 mg/0.3 mL Soaj injection Commonly known as: EpiPen 2-Pak Inject 0.3 mLs (0.3 mg total) into the muscle as needed for anaphylaxis.       Discharge Assessment: Vitals:   09/28/19 1500 09/28/19 1527  BP: (!) 152/107 122/86  Pulse: 71 75  Resp: 14   Temp:  98 F (36.7 C)  SpO2: 97% 100%   Skin clean, dry and intact without evidence of skin break down, no evidence of skin tears noted. IV catheter discontinued intact. Site without signs and symptoms of complications - no redness or edema noted at insertion site, patient denies c/o pain - only slight tenderness at site.  Dressing with slight pressure applied.  D/c Instructions-Education: Discharge instructions given to patient/family with verbalized understanding. D/c education completed with patient/family including follow up instructions, medication list, d/c activities limitations if indicated, with other d/c instructions as indicated by MD - patient able to verbalize understanding, all questions fully answered. Patient instructed to return to ED, call 911, or call MD for any changes in condition.  Patient escorted via WC, and D/C home via private auto.  Kizzie Bane, RN 09/28/2019 6:49 PM
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
## 2021-12-01 IMAGING — DX DG CHEST 1V PORT
1 series · 1 of 1 positions shown · non-contrast
Comparison: 06/09/2018 chest radiograph.

CLINICAL DATA: Dyspnea and chest pain

EXAM:
PORTABLE CHEST 1 VIEW

[chest ap]
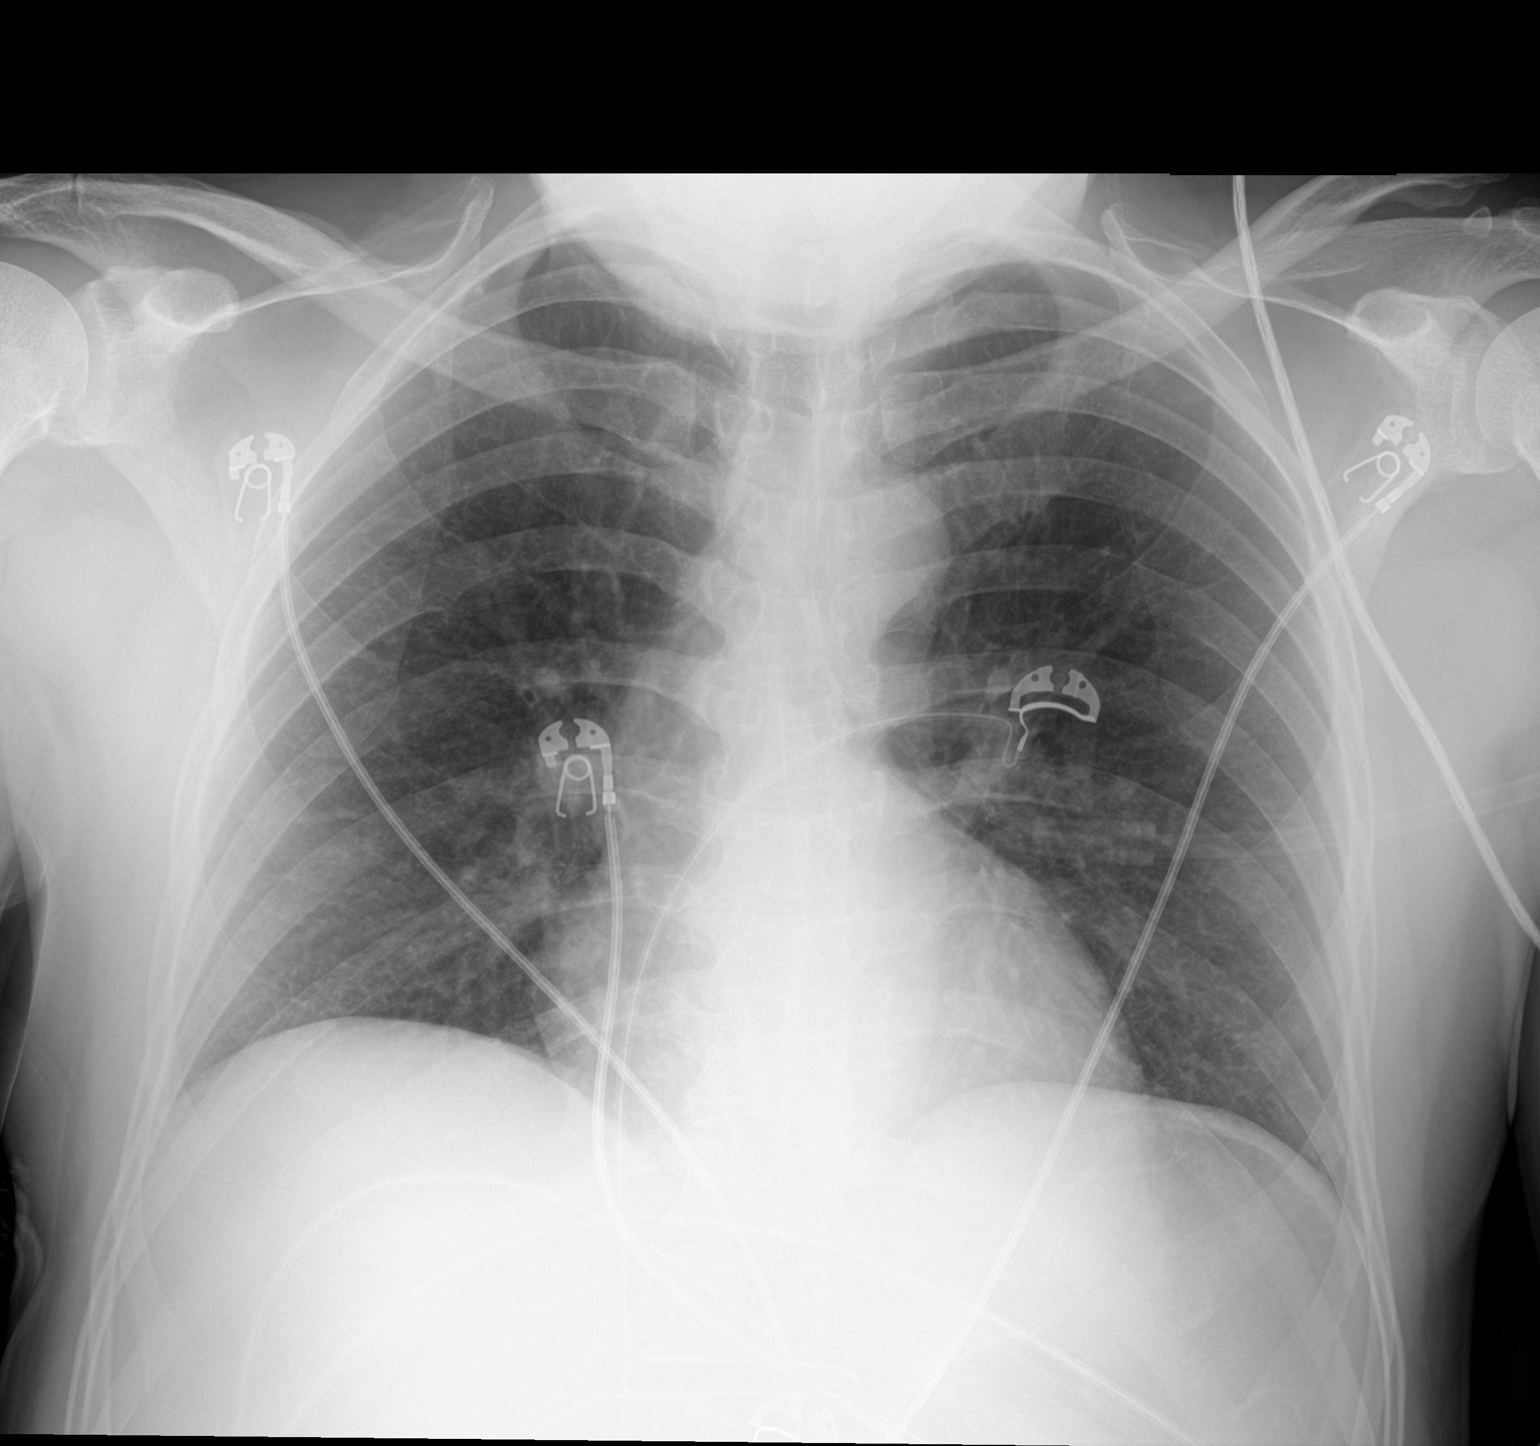

[1 of 1 positions shown; findings below may reference images not displayed]

FINDINGS: Stable cardiomediastinal silhouette with normal heart size. No
pneumothorax. No pleural effusion. Lungs appear clear, with no acute
consolidative airspace disease and no pulmonary edema.
IMPRESSION: No active disease.
